# Patient Record
Sex: Male | Born: 1990 | Race: White | Hispanic: No | Marital: Married | Smoking: Current some day smoker
Health system: Southern US, Community
[De-identification: ages and names within clinical notes are randomized; demographics above are authoritative.]

---

## 2016-12-07 ENCOUNTER — Emergency Department
Admission: EM | Admit: 2016-12-07 | Discharge: 2016-12-07 | Disposition: A | Discharge status: Home / Self Care | Payer: BLUE CROSS/BLUE SHIELD | Attending: Emergency Medicine | Admitting: Emergency Medicine

## 2016-12-07 ENCOUNTER — Encounter: Payer: Self-pay | Admitting: Emergency Medicine

## 2016-12-07 DIAGNOSIS — F17200 Nicotine dependence, unspecified, uncomplicated: Secondary | ICD-10-CM | POA: Insufficient documentation

## 2016-12-07 DIAGNOSIS — R1084 Generalized abdominal pain: Secondary | ICD-10-CM | POA: Diagnosis present

## 2016-12-07 DIAGNOSIS — R197 Diarrhea, unspecified: Secondary | ICD-10-CM | POA: Diagnosis not present

## 2016-12-07 LAB — COMPREHENSIVE METABOLIC PANEL
ALK PHOS: 73 U/L (ref 38–126)
ALT: 24 U/L (ref 17–63)
AST: 19 U/L (ref 15–41)
Albumin: 4.5 g/dL (ref 3.5–5.0)
Anion gap: 7 (ref 5–15)
BILIRUBIN TOTAL: 0.7 mg/dL (ref 0.3–1.2)
BUN: 18 mg/dL (ref 6–20)
CALCIUM: 9.1 mg/dL (ref 8.9–10.3)
CO2: 23 mmol/L (ref 22–32)
CREATININE: 0.83 mg/dL (ref 0.61–1.24)
Chloride: 107 mmol/L (ref 101–111)
GFR calc Af Amer: >60 mL/min (ref >60)
Glucose, Bld: 113 mg/dL — ABNORMAL HIGH (ref 65–99)
Potassium: 4.1 mmol/L (ref 3.5–5.1)
Sodium: 137 mmol/L (ref 135–145)
Total Protein: 7.9 g/dL (ref 6.5–8.1)

## 2016-12-07 LAB — CBC
HEMATOCRIT: 45.5 % (ref 40.0–52.0)
HEMOGLOBIN: 15.7 g/dL (ref 13.0–18.0)
MCH: 29.1 pg (ref 26.0–34.0)
MCHC: 34.4 g/dL (ref 32.0–36.0)
MCV: 84.5 fL (ref 80.0–100.0)
Platelets: 174 10*3/uL (ref 150–440)
RBC: 5.39 MIL/uL (ref 4.40–5.90)
RDW: 12.8 % (ref 11.5–14.5)
WBC: 10.1 10*3/uL (ref 3.8–10.6)

## 2016-12-07 LAB — LIPASE, BLOOD: Lipase: 22 U/L (ref 11–51)

## 2016-12-07 MED ORDER — IBUPROFEN 800 MG PO TABS
800.0000 mg | ORAL_TABLET | Freq: Once | ORAL | Status: AC
  Administered 2016-12-07: 800 mg via ORAL
  Filled 2016-12-07: qty 1

## 2016-12-07 NOTE — ED Provider Notes (Signed)
[facility] Emergency Department Provider Note ____________________________________________   I have reviewed the triage vital signs and the triage nursing note.  HISTORY  Chief Complaint Abdominal Pain   Historian Patient  HPI Devon Walters is a 26 y.o. male with no significant past medical history presents for episodic abdominal pain.  Patient states he had some crampy abdominal pain on Friday with some watery, nonbloody diarrhea on Friday.  He felt fine yesterday which was Saturday.  Overnight he woke up in the night around 3 or so with crampy sharp abdominal pain and took Tylenol.  Then he woke up some hours later with another episode of crampy abdominal pain.  No fevers or chills.  No dysuria or hematuria.  He felt like he had some back discomfort when the belly pain was really bad, but currently the pain is gone.  He says he feels a little uneasy, more like he is concerned that it will come back, no ongoing pain right now.  Nothing seemed to make it worse or better.  No known bad food exposures or significant travel history.  Denies history of constipation.   History reviewed. No pertinent past medical history.  There are no active problems to display for this patient.   History reviewed. No pertinent surgical history.  Prior to Admission medications   Not on File    No Known Allergies  No family history on file.  Social History Social History   Tobacco Use  . Smoking status: Current Some Day Smoker  . Smokeless tobacco: Never Used  Substance Use Topics  . Alcohol use: Yes  . Drug use: No    Review of Systems  Constitutional: Negative for fever. Eyes: Negative for visual changes. ENT: Negative for sore throat. Cardiovascular: Negative for chest pain. Respiratory: Negative for shortness of breath. Gastrointestinal: Positive for some mild nausea without vomiting.  Mild watery diarrhea and cramping abdominal pain which  location moves around across the abdomen, but no right lower quadrant abdominal pain. Genitourinary: Negative for dysuria. Musculoskeletal: At one point there was some pain when his abdomen is cramping.  Currently gone. Skin: Negative for rash. Neurological: Negative for headache.  ____________________________________________   PHYSICAL EXAM:  VITAL SIGNS: ED Triage Vitals [12/07/16 0653]  Enc Vitals Group     BP (!) 146/108     Pulse Rate 62     Resp 18     Temp 97.8 F (36.6 C)     Temp Source Oral     SpO2 98 %     Weight 180 lb (81.6 kg)     Height 5\' 11"  (1.803 m)     Head Circumference      Peak Flow      Pain Score 8     Pain Loc      Pain Edu?      Excl. in GC?      Constitutional: Alert and oriented. Well appearing and in no distress. HEENT   Head: Normocephalic and atraumatic.      Eyes: Conjunctivae are normal. Pupils equal and round.       Ears:         Nose: No congestion/rhinnorhea.   Mouth/Throat: Mucous membranes are moist.   Neck: No stridor. Cardiovascular/Chest: Normal rate, regular rhythm.  No murmurs, rubs, or gallops. Respiratory: Normal respiratory effort without tachypnea nor retractions. Breath sounds are clear and equal bilaterally. No wheezes/rales/rhonchi. Gastrointestinal: Soft. No distention, no guarding, no rebound. Nontender to superficial deep palpation  in 4 quadrants.  No McBurney's point tenderness.    Genitourinary/rectal:Deferred Musculoskeletal: Nontender with normal range of motion in all extremities. No joint effusions.  No lower extremity tenderness.  No edema. Neurologic:  Normal speech and language. No gross or focal neurologic deficits are appreciated. Skin:  Skin is warm, dry and intact. No rash noted. Psychiatric: Mood and affect are normal. Speech and behavior are normal. Patient exhibits appropriate insight and judgment.   ____________________________________________  LABS (pertinent positives/negatives) I,  Governor Rooks, MD the attending physician have reviewed the labs noted below.  Labs Reviewed  COMPREHENSIVE METABOLIC PANEL - Abnormal; Notable for the following components:      Result Value   Glucose, Bld 113 (*)    All other components within normal limits  LIPASE, BLOOD  CBC  URINALYSIS, COMPLETE (UACMP) WITH MICROSCOPIC    ____________________________________________    EKG I, Governor Rooks, MD, the attending physician have personally viewed and interpreted all ECGs.  None ____________________________________________  RADIOLOGY All Xrays were viewed by me.  Imaging interpreted by Radiologist, and I, Governor Rooks, MD the attending physician have reviewed the radiologist interpretation noted below.  None __________________________________________  PROCEDURES  Procedure(s) performed: None  Critical Care performed: None   ____________________________________________  ED COURSE / ASSESSMENT AND PLAN  Pertinent labs & imaging results that were available during my care of the patient were reviewed by me and considered in my medical decision making (see chart for details).    History of crampy intermittent abdominal pain where the location is moving from mid abdomen to the left and to the right, no certain pattern, with a 24-hour period of no discomfort, and currently resolved, I am most suspicious for likely GI source.  Exam now as well as history and laboratory studies are reassuring, and at this point I have a low suspicion for medical or surgical abdominal emergency.  I discussed this with the patient, and we discussed risk versus benefit of CT imaging and chose to hold off at this point in time.  He is not having any urinary symptoms, symptoms do not sound like a kidney stone based on moving from side to side, patient was unable to give a urine sample due to essentially performance anxiety, and he stated he would rather just go home because he does not want to stick  around to have to urinate and wait for the result.  I think this is overall reasonable.  He was able to jump up and down by the side of the bed without any discomfort.   DIFFERENTIAL DIAGNOSIS: Differential diagnosis includes, but is not limited to, biliary disease (biliary colic, acute cholecystitis, cholangitis, choledocholithiasis, etc), intrathoracic causes for epigastric abdominal pain including ACS, gastritis, duodenitis, pancreatitis, small bowel or large bowel obstruction, abdominal aortic aneurysm, hernia, and gastritis.  Differential diagnosis includes, but is not limited to, acute appendicitis, renal colic, testicular torsion, urinary tract infection/pyelonephritis, prostatitis,  epididymitis, diverticulitis, small bowel obstruction or ileus, colitis, abdominal aortic aneurysm, gastroenteritis, hernia, etc.  CONSULTATIONS:   None   Patient / Family / Caregiver informed of clinical course, medical decision-making process, and agree with plan.  I discussed return precautions, follow-up instructions, and discharge instructions with patient and/or family.  Discharge Instructions : You are evaluated for abdominal pain which is intermittent and gone here in the emergency department, as well as diarrhea.  Although no certain cause was found, your exam and evaluation are overall reassuring in the emergency department today.  Return to the emergency room  immediately for any new or worsening symptoms including uncontrolled pain, pain in the right lower abdomen, fever, black or bloody stool, vomiting blood, dizziness or passing out, chest pain or trouble breathing, or any other symptoms concerning to you.    ___________________________________________   FINAL CLINICAL IMPRESSION(S) / ED DIAGNOSES   Final diagnoses:  Generalized abdominal pain  Diarrhea, unspecified type      ___________________________________________  ED Discharge Orders    None            Note: This  dictation was prepared with Dragon dictation. Any transcriptional errors that result from this process are unintentional    Governor RooksLord, Tyshawn Keel, MD 12/07/16 [phone]

## 2016-12-07 NOTE — ED Triage Notes (Signed)
Patient with complaint of generalized abdominal pain, nausea and diarrhea that he starts started Friday night. Patient states that he felt better during the day Saturday but that the pain returned last night.

## 2016-12-07 NOTE — Discharge Instructions (Signed)
You are evaluated for abdominal pain which is intermittent and gone here in the emergency department, as well as diarrhea.  Although no certain cause was found, your exam and evaluation are overall reassuring in the emergency department today.  Return to the emergency room immediately for any new or worsening symptoms including uncontrolled pain, pain in the right lower abdomen, fever, black or bloody stool, vomiting blood, dizziness or passing out, chest pain or trouble breathing, or any other symptoms concerning to you.

## 2018-06-14 ENCOUNTER — Other Ambulatory Visit: Payer: Self-pay | Admitting: Family Medicine

## 2018-06-14 DIAGNOSIS — R1013 Epigastric pain: Secondary | ICD-10-CM

## 2018-06-17 ENCOUNTER — Ambulatory Visit
Admission: RE | Admit: 2018-06-17 | Discharge: 2018-06-17 | Disposition: A | Discharge status: Home / Self Care | Payer: BLUE CROSS/BLUE SHIELD | Source: Ambulatory Visit | Attending: Family Medicine | Admitting: Family Medicine

## 2018-06-17 ENCOUNTER — Other Ambulatory Visit: Payer: Self-pay

## 2018-06-17 DIAGNOSIS — R1013 Epigastric pain: Secondary | ICD-10-CM | POA: Insufficient documentation

## 2018-10-28 ENCOUNTER — Other Ambulatory Visit: Payer: Self-pay

## 2018-10-28 DIAGNOSIS — Z20822 Contact with and (suspected) exposure to covid-19: Secondary | ICD-10-CM

## 2018-10-30 LAB — NOVEL CORONAVIRUS, NAA: SARS-CoV-2, NAA: NOT DETECTED

## 2020-04-05 ENCOUNTER — Other Ambulatory Visit: Payer: Self-pay | Admitting: Gastroenterology

## 2020-04-05 ENCOUNTER — Other Ambulatory Visit (HOSPITAL_COMMUNITY): Payer: Self-pay | Admitting: Gastroenterology

## 2020-04-05 DIAGNOSIS — R1013 Epigastric pain: Secondary | ICD-10-CM

## 2020-05-08 ENCOUNTER — Ambulatory Visit
Admission: RE | Admit: 2020-05-08 | Discharge: 2020-05-08 | Disposition: A | Discharge status: Home / Self Care | Payer: 59 | Source: Ambulatory Visit | Attending: Gastroenterology | Admitting: Gastroenterology

## 2020-05-08 ENCOUNTER — Other Ambulatory Visit: Payer: Self-pay

## 2020-05-08 ENCOUNTER — Encounter
Admission: RE | Admit: 2020-05-08 | Discharge: 2020-05-08 | Disposition: A | Discharge status: Home / Self Care | Payer: 59 | Source: Ambulatory Visit | Attending: Gastroenterology | Admitting: Gastroenterology

## 2020-05-08 DIAGNOSIS — R1013 Epigastric pain: Secondary | ICD-10-CM | POA: Insufficient documentation

## 2020-05-08 MED ORDER — TECHNETIUM TC 99M MEBROFENIN IV KIT
4.9930 | PACK | Freq: Once | INTRAVENOUS | Status: AC | PRN
  Administered 2020-05-08: 4.993 via INTRAVENOUS

## 2020-08-02 IMAGING — US ULTRASOUND ABDOMEN LIMITED
1 series · 14 of 25 positions shown · non-contrast
Comparison: None.

CLINICAL DATA: Acute epigastric pain

EXAM:
ULTRASOUND ABDOMEN LIMITED RIGHT UPPER QUADRANT

[Series 1: ultrasound abdomen limited · 14 of 56 slices shown]
[im 1/56]
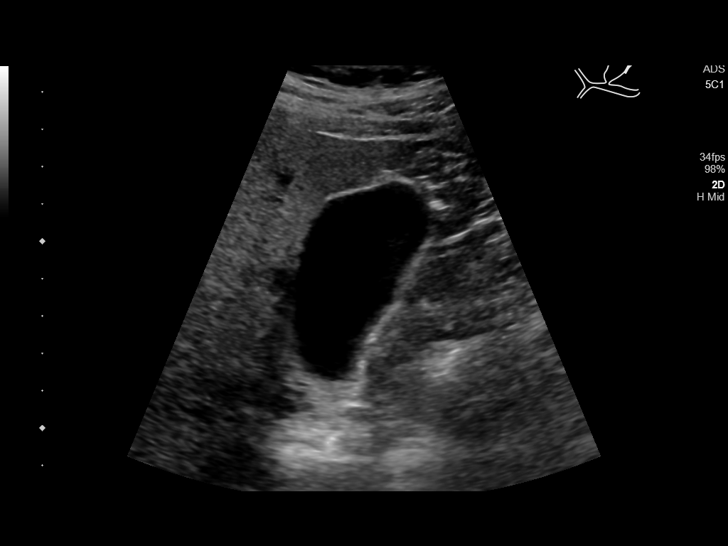
[im 5/56]
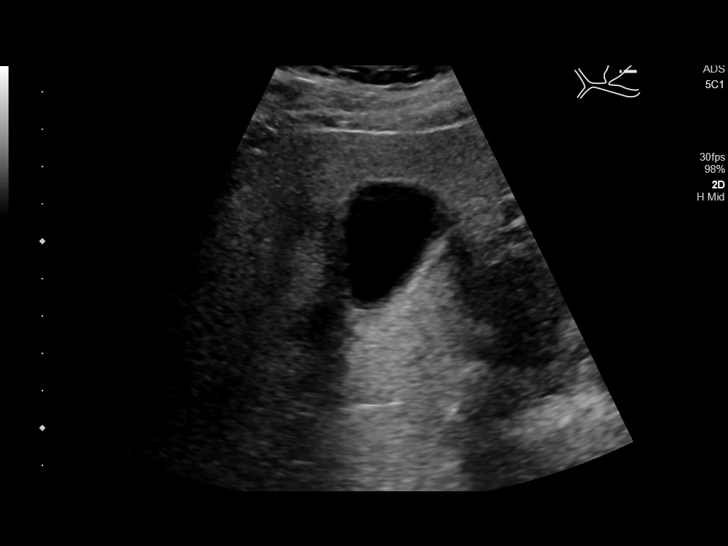
[im 10/56]
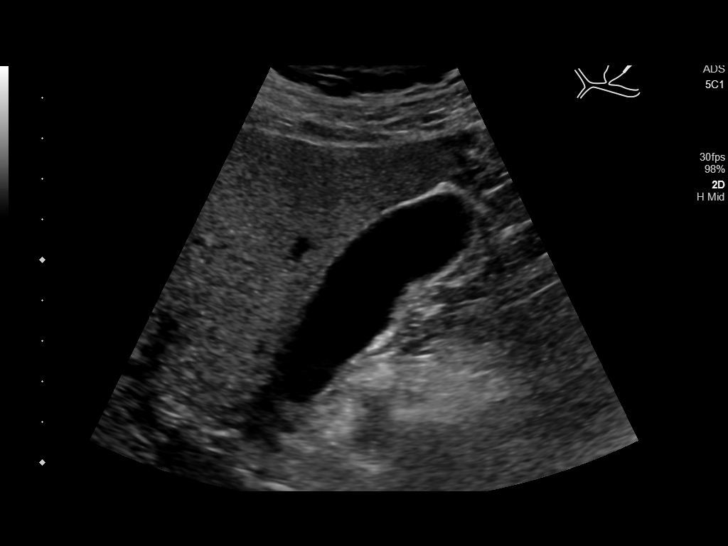
[im 14/56]
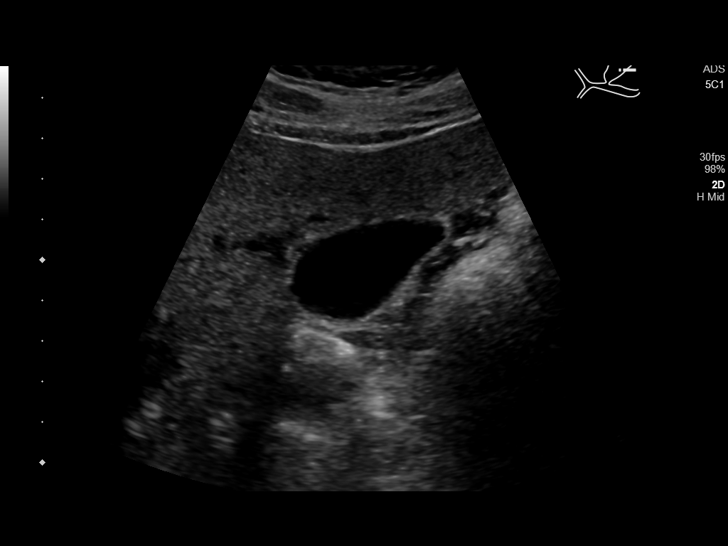
[im 19/56]
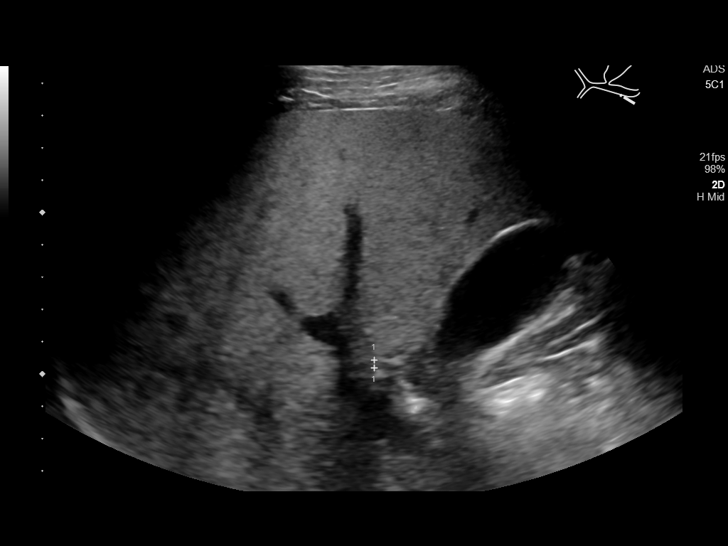
[im 21/56]
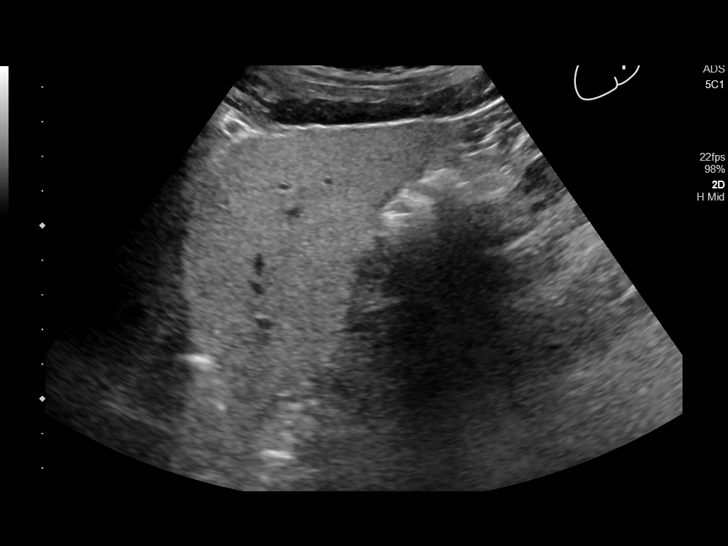
[im 26/56]
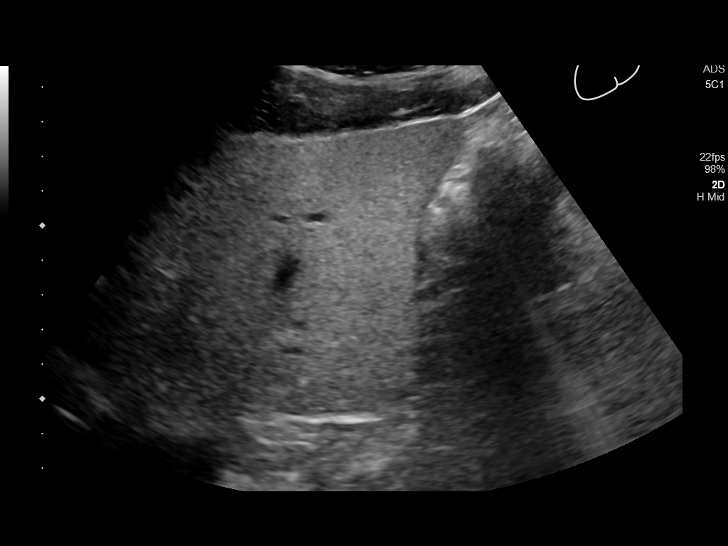
[im 30/56]
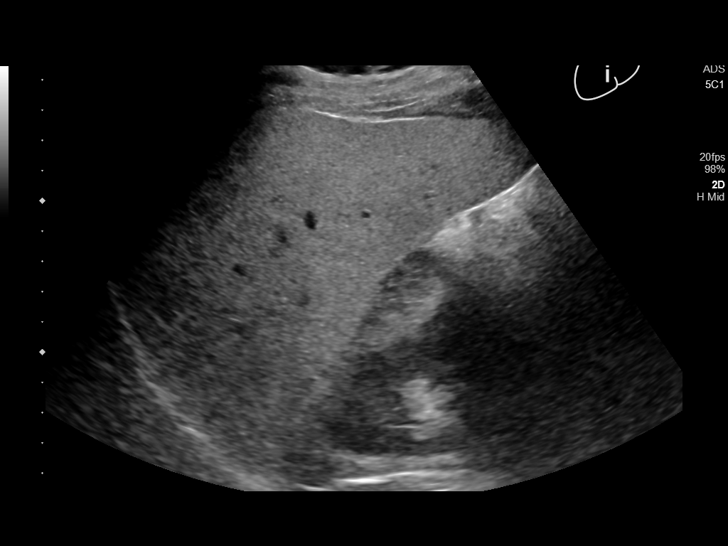
[im 35/56]
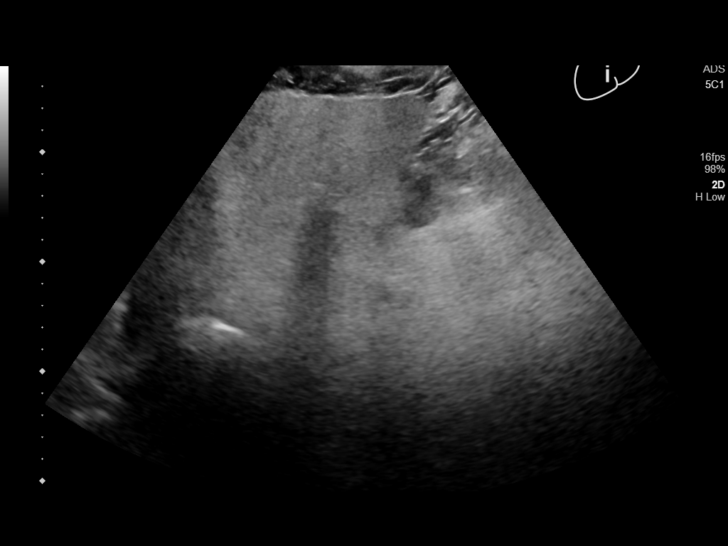
[im 37/56]
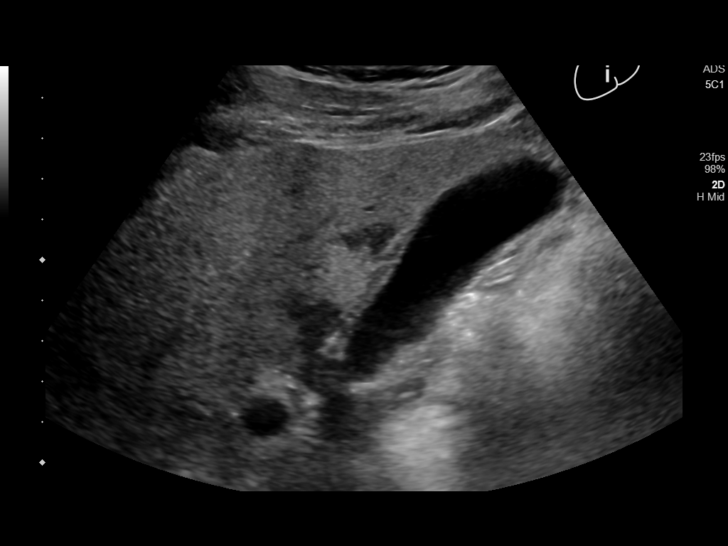
[im 42/56]
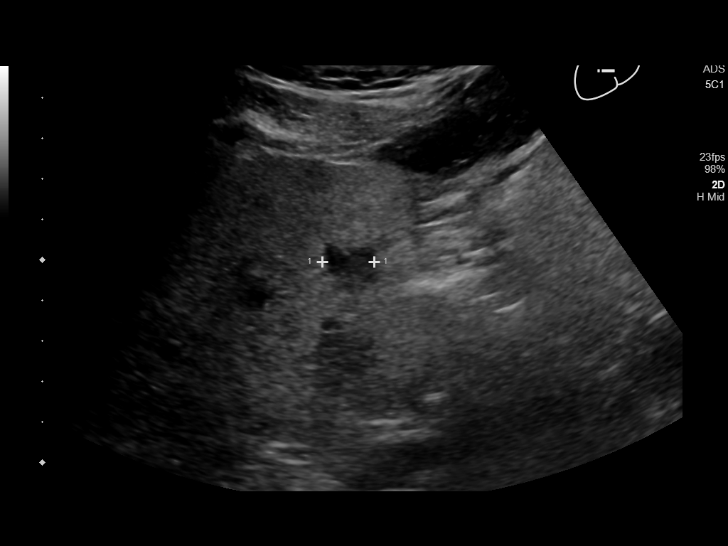
[im 46/56]
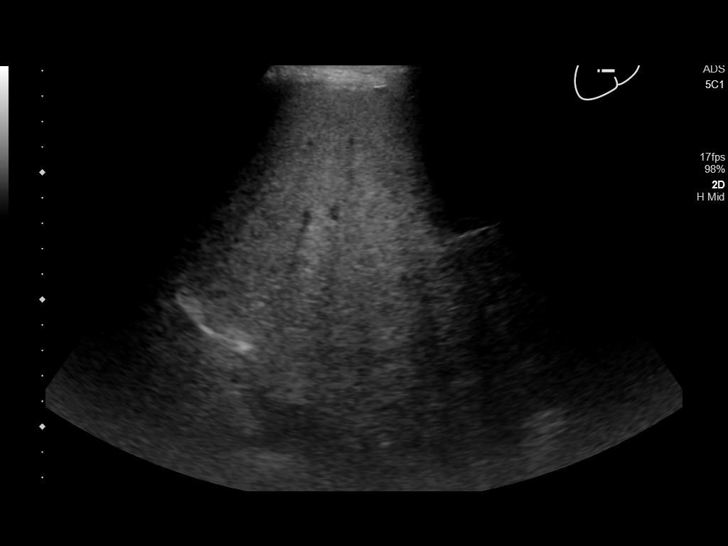
[im 51/56]
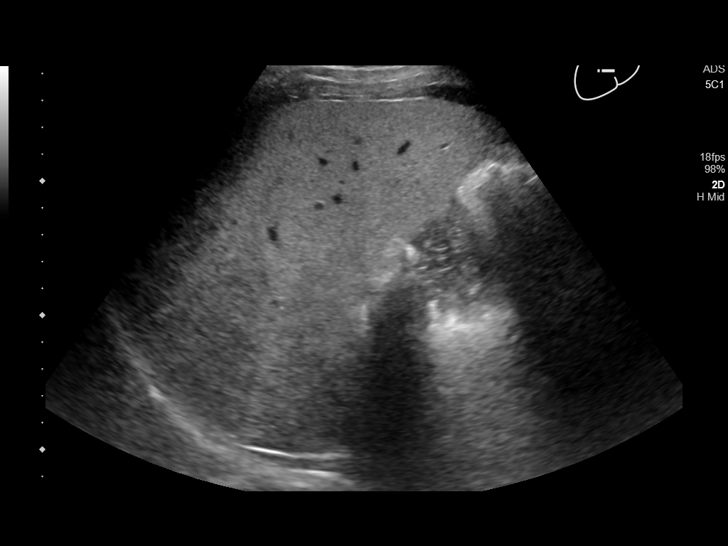
[im 56/56]
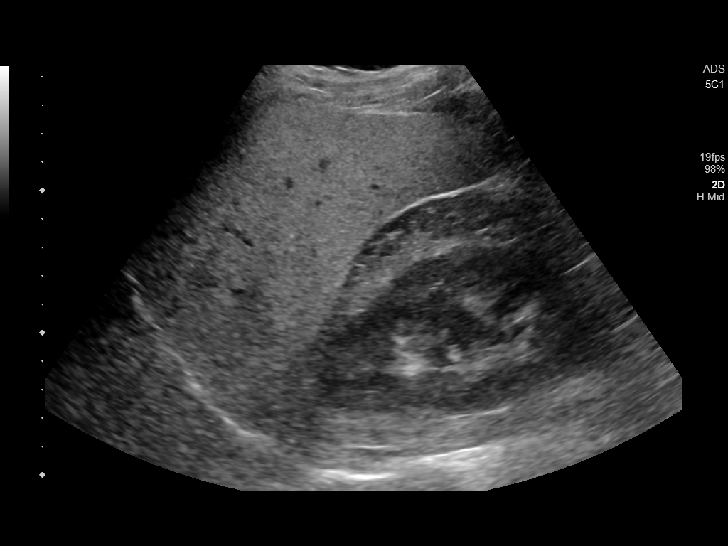

[14 of 25 positions shown; findings below may reference images not displayed]

FINDINGS: Gallbladder:

No gallstones or wall thickening visualized. No sonographic Murphy
sign noted by sonographer.

Common bile duct:

Diameter: 2.4 mm.

Liver:

Diffusely increased in echogenicity. Two small focal areas of
decreased echogenicity are noted adjacent to the gallbladder fossa
likely representing areas of focal fatty sparing. No mass lesion is
seen. Portal vein is patent on color Doppler imaging with normal
direction of blood flow towards the liver.
IMPRESSION: Fatty liver.

## 2021-03-07 DIAGNOSIS — M542 Cervicalgia: Secondary | ICD-10-CM | POA: Diagnosis not present

## 2021-03-07 DIAGNOSIS — R079 Chest pain, unspecified: Secondary | ICD-10-CM | POA: Diagnosis not present

## 2021-03-07 DIAGNOSIS — R0789 Other chest pain: Secondary | ICD-10-CM | POA: Diagnosis not present

## 2021-03-13 DIAGNOSIS — M542 Cervicalgia: Secondary | ICD-10-CM | POA: Diagnosis not present

## 2021-03-27 DIAGNOSIS — M6281 Muscle weakness (generalized): Secondary | ICD-10-CM | POA: Diagnosis not present

## 2021-03-27 DIAGNOSIS — M542 Cervicalgia: Secondary | ICD-10-CM | POA: Diagnosis not present

## 2021-04-10 DIAGNOSIS — M542 Cervicalgia: Secondary | ICD-10-CM | POA: Diagnosis not present

## 2021-04-10 DIAGNOSIS — M6281 Muscle weakness (generalized): Secondary | ICD-10-CM | POA: Diagnosis not present

## 2021-04-30 DIAGNOSIS — M6281 Muscle weakness (generalized): Secondary | ICD-10-CM | POA: Diagnosis not present

## 2021-04-30 DIAGNOSIS — M542 Cervicalgia: Secondary | ICD-10-CM | POA: Diagnosis not present

## 2021-05-14 DIAGNOSIS — M6281 Muscle weakness (generalized): Secondary | ICD-10-CM | POA: Diagnosis not present

## 2021-05-14 DIAGNOSIS — M542 Cervicalgia: Secondary | ICD-10-CM | POA: Diagnosis not present

## 2021-06-19 DIAGNOSIS — Z0000 Encounter for general adult medical examination without abnormal findings: Secondary | ICD-10-CM | POA: Diagnosis not present

## 2021-06-19 DIAGNOSIS — K219 Gastro-esophageal reflux disease without esophagitis: Secondary | ICD-10-CM | POA: Diagnosis not present

## 2022-06-24 IMAGING — US US ABDOMEN LIMITED RUQ/ASCITES
1 series · 15 of 25 positions shown · non-contrast
Comparison: None.

CLINICAL DATA: Epigastric abdominal pain, dyspepsia

EXAM:
ULTRASOUND ABDOMEN LIMITED RIGHT UPPER QUADRANT

[Series 1: us abdomen limited ruq · 15 of 40 slices shown]
[im 1/40]
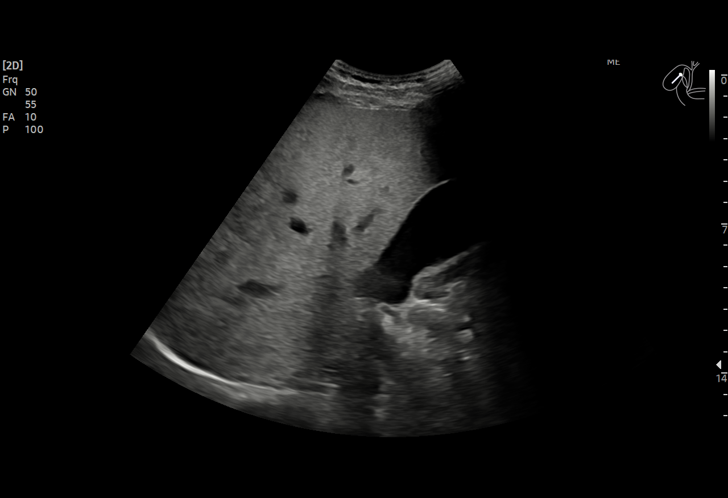
[im 4/40]
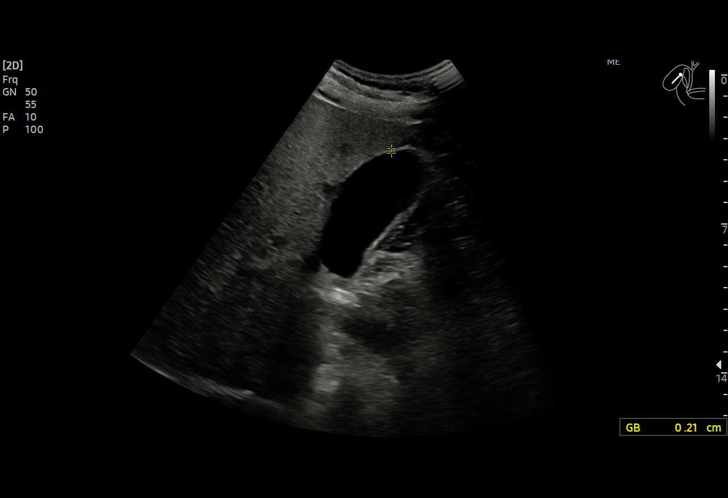
[im 7/40]
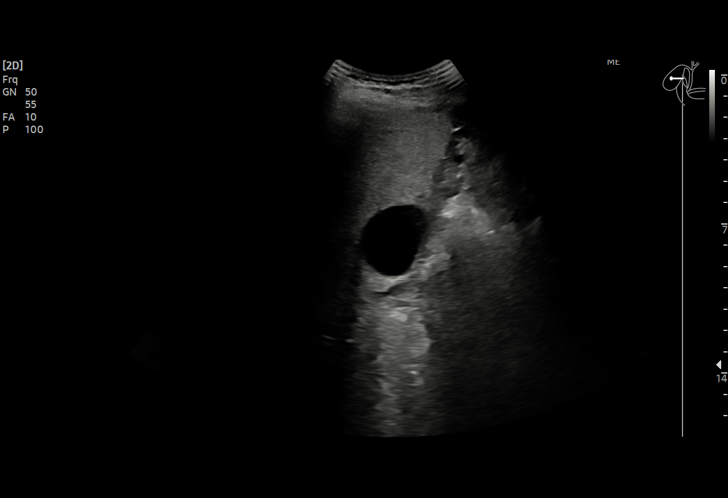
[im 9/40]
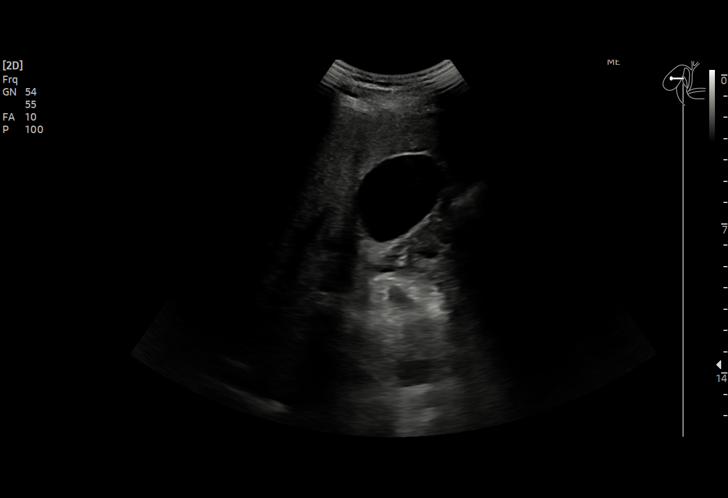
[im 12/40]
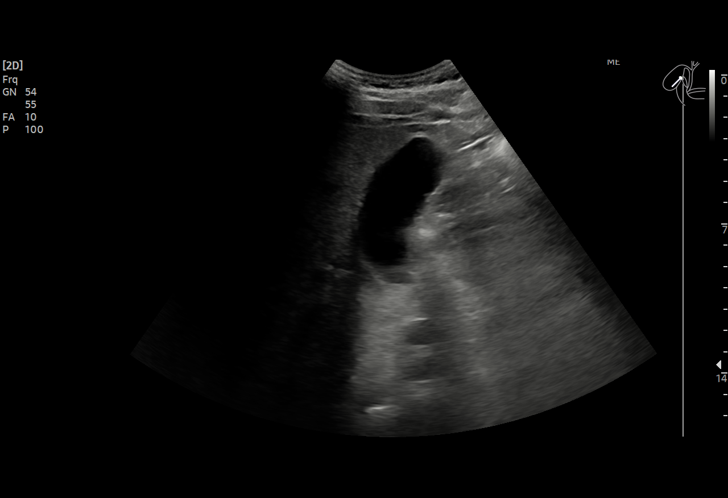
[im 15/40]
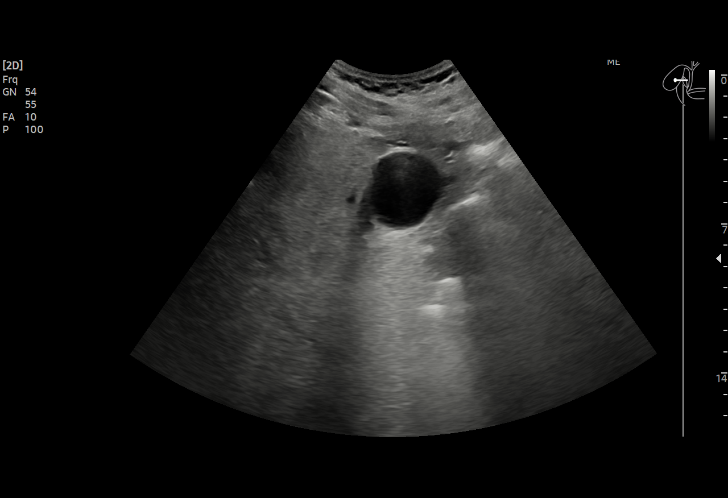
[im 17/40]
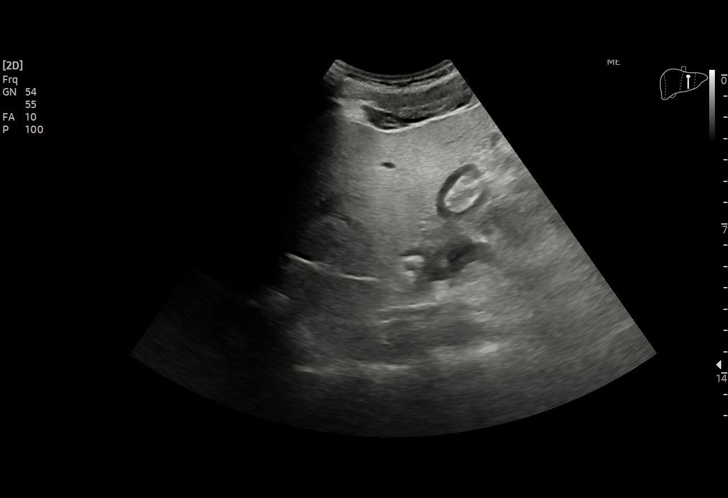
[im 20/40]
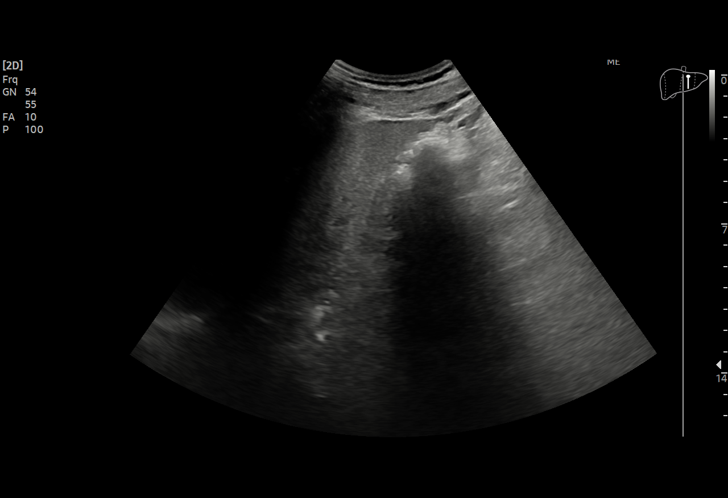
[im 23/40]
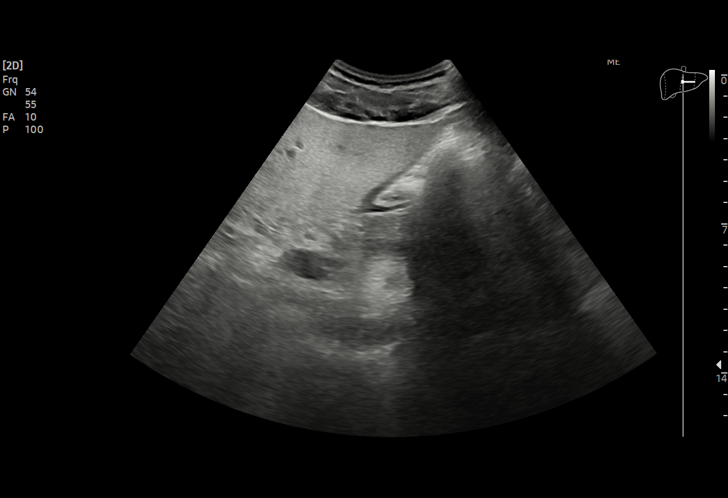
[im 25/40]
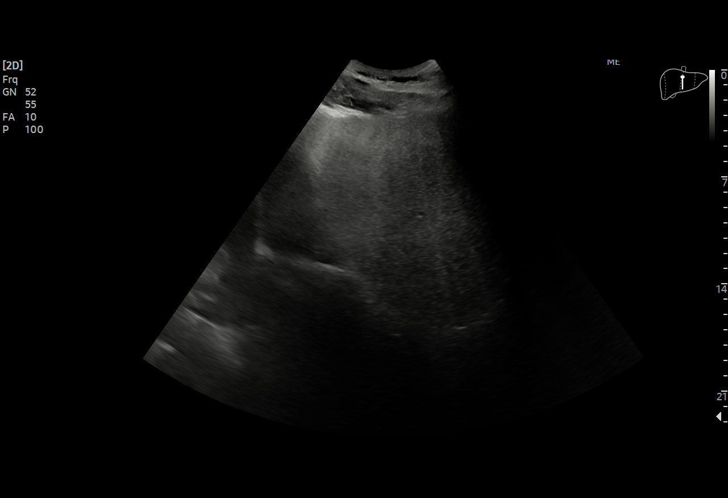
[im 28/40]
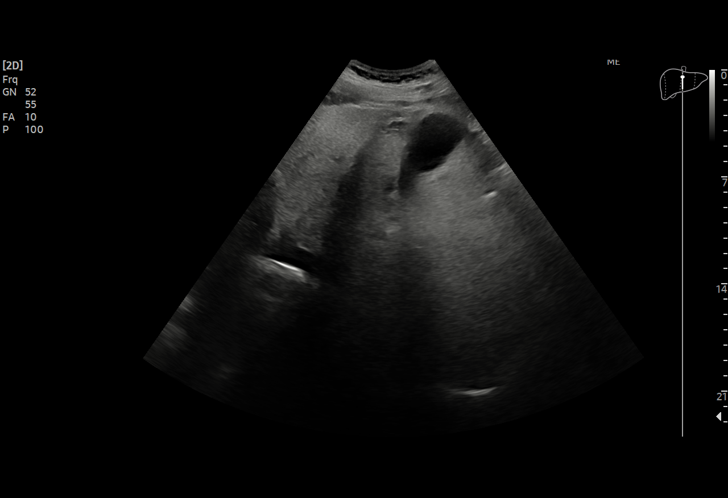
[im 31/40]
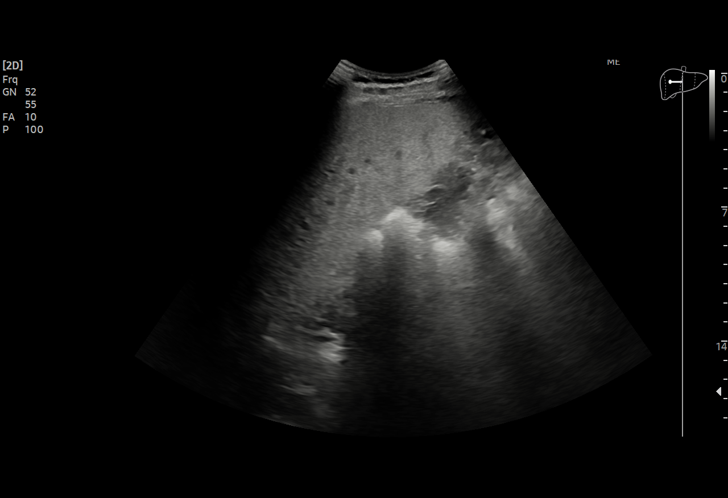
[im 33/40]
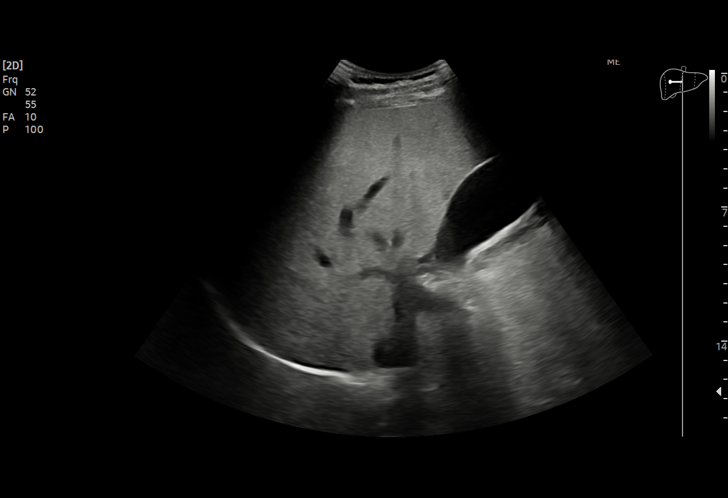
[im 36/40]
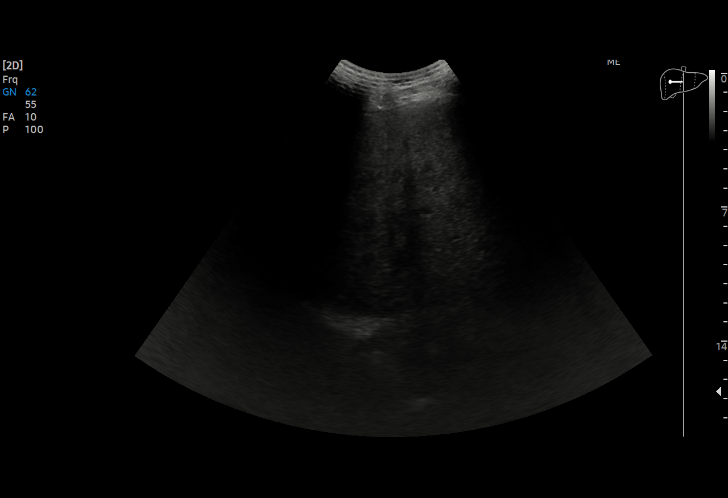
[im 40/40]
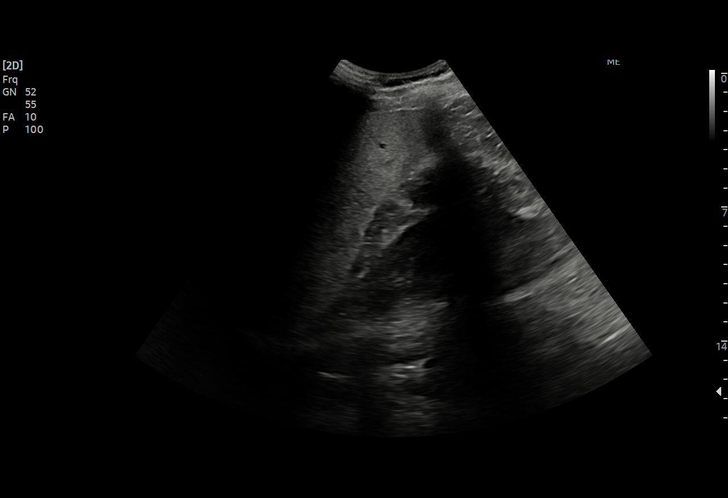

[15 of 25 positions shown; findings below may reference images not displayed]

FINDINGS: Gallbladder:

No gallstones or wall thickening visualized. No sonographic Murphy
sign noted by sonographer.

Common bile duct:

Diameter: 3 mm in proximal diameter

Liver:

No focal lesion identified. Within normal limits in parenchymal
echogenicity. Portal vein is patent on color Doppler imaging with
normal direction of blood flow towards the liver.

Other: No ascites
IMPRESSION: Normal examination

## 2022-06-24 IMAGING — NM NM HEPATO W/GB/PHARM/[PERSON_NAME]
2 series · 12 of 12 positions shown · non-contrast
Comparison: None.

CLINICAL DATA: Epigastric pain.  Dyspepsia

EXAM:
NUCLEAR MEDICINE HEPATOBILIARY IMAGING WITH GALLBLADDER EF
TECHNIQUE: Sequential images of the abdomen were obtained [DATE] minutes
following intravenous administration of radiopharmaceutical. After
oral ingestion of Ensure, gallbladder ejection fraction was
determined. At 60 min, normal ejection fraction is greater than 33%.
RADIOPHARMACEUTICALS:  5.0 mCi Lc-JJm  Choletec IV

[Series 1000: hepatobiliary scan · 9.59mm/px · 6 of 60 frames shown]
[frame 6/60]
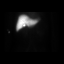
[frame 16/60]
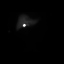
[frame 26/60]
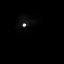
[frame 36/60]
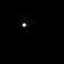
[frame 46/60]
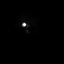
[frame 56/60]
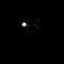

[Series 1000: gallbladder ef · 4.80mm/px · 6 of 120 frames shown]
[frame 11/120]
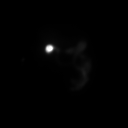
[frame 31/120]
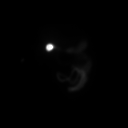
[frame 51/120]
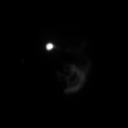
[frame 71/120]
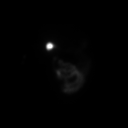
[frame 91/120]
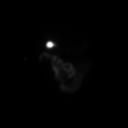
[frame 111/120]
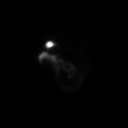

[12 of 12 positions shown; findings below may reference images not displayed]

FINDINGS: Prompt uptake and biliary excretion of activity by the liver is
seen. Gallbladder activity is visualized, consistent with patency of
cystic duct. Biliary activity passes into small bowel, consistent
with patent common bile duct.

Gallbladder contracted appropriately following fatty meal.

Calculated gallbladder ejection fraction is 42%. (Normal gallbladder
ejection fraction with Ensure is greater than 33%.)
IMPRESSION: 1. Patent cystic duct and common bile duct.
2. Normal gallbladder ejection fraction.
